# Patient Record
Sex: Female | Born: 1990 | Race: White | Hispanic: No | Marital: Married | Smoking: Never smoker
Health system: Southern US, Community
[De-identification: ages and names within clinical notes are randomized; demographics above are authoritative.]

## PROBLEM LIST (undated history)

## (undated) DIAGNOSIS — Z789 Other specified health status: Secondary | ICD-10-CM

## (undated) DIAGNOSIS — K900 Celiac disease: Secondary | ICD-10-CM

## (undated) DIAGNOSIS — M542 Cervicalgia: Secondary | ICD-10-CM

## (undated) HISTORY — DX: Cervicalgia: M54.2

## (undated) HISTORY — DX: Celiac disease: K90.0

---

## 2016-04-22 HISTORY — PX: TOOTH EXTRACTION: SUR596

## 2017-11-24 ENCOUNTER — Ambulatory Visit: Payer: Self-pay | Admitting: Diagnostic Neuroimaging

## 2017-12-29 ENCOUNTER — Ambulatory Visit: Payer: BC Managed Care – PPO | Admitting: Diagnostic Neuroimaging

## 2017-12-29 ENCOUNTER — Encounter: Payer: Self-pay | Admitting: Diagnostic Neuroimaging

## 2017-12-29 ENCOUNTER — Encounter

## 2017-12-29 VITALS — BP 101/71 | HR 85 | Ht 62.0 in | Wt 97.6 lb

## 2017-12-29 DIAGNOSIS — R200 Anesthesia of skin: Secondary | ICD-10-CM | POA: Diagnosis not present

## 2017-12-29 DIAGNOSIS — M542 Cervicalgia: Secondary | ICD-10-CM

## 2017-12-29 DIAGNOSIS — F0781 Postconcussional syndrome: Secondary | ICD-10-CM | POA: Diagnosis not present

## 2017-12-29 NOTE — Patient Instructions (Signed)
RIGHT SIDED NUMBNESS / PAIN (POST CONCUSSION SYNDROME)  - check MRI brain and cervical spine  - ok to return to PT  - consider massage therapy or acupuncture  - optimize sleep, nutrition, PT exercises
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
## 2018-01-01 ENCOUNTER — Telehealth: Payer: Self-pay | Admitting: Diagnostic Neuroimaging

## 2018-01-01 NOTE — Telephone Encounter (Signed)
Patient returned my call she is scheduled for 01/03/18 at [facility]. °

## 2018-01-01 NOTE — Telephone Encounter (Signed)
lvm for pt to call back about scheduling mri  [employer] Auth: [id number] (exp. 01/01/18 to 01/30/18)

## 2018-01-03 ENCOUNTER — Ambulatory Visit: Payer: BC Managed Care – PPO

## 2018-01-03 DIAGNOSIS — R200 Anesthesia of skin: Secondary | ICD-10-CM

## 2018-01-03 DIAGNOSIS — M542 Cervicalgia: Secondary | ICD-10-CM

## 2018-01-05 ENCOUNTER — Telehealth: Payer: Self-pay | Admitting: *Deleted

## 2018-01-05 NOTE — Telephone Encounter (Signed)
Spoke with patient and informed her that her MRI brain is normal. Her MRI cervical spine is unremarkable,  shows no narrowing or cord lesions. Advised there is minimal bulging. Advised she continue with PT, per Dr Richrd HumblesPenumalli's plan she may consider massage therapy or acupuncture. She asked if the bulging would go away. This RN advised that hopefully with PT it will improve. She verbalized understanding, appreciation for call.

## 2020-10-30 ENCOUNTER — Ambulatory Visit (INDEPENDENT_AMBULATORY_CARE_PROVIDER_SITE_OTHER): Payer: BC Managed Care – PPO | Admitting: Women's Health

## 2020-10-30 ENCOUNTER — Other Ambulatory Visit (HOSPITAL_COMMUNITY)
Admission: RE | Admit: 2020-10-30 | Discharge: 2020-10-30 | Disposition: A | Discharge status: Home / Self Care | Payer: BC Managed Care – PPO | Source: Ambulatory Visit | Attending: Obstetrics & Gynecology | Admitting: Obstetrics & Gynecology

## 2020-10-30 ENCOUNTER — Encounter: Payer: Self-pay | Admitting: Women's Health

## 2020-10-30 ENCOUNTER — Other Ambulatory Visit: Payer: Self-pay

## 2020-10-30 VITALS — BP 103/77 | HR 81 | Ht 62.0 in | Wt 103.0 lb

## 2020-10-30 DIAGNOSIS — N941 Unspecified dyspareunia: Secondary | ICD-10-CM

## 2020-10-30 DIAGNOSIS — Z01419 Encounter for gynecological examination (general) (routine) without abnormal findings: Secondary | ICD-10-CM

## 2020-10-30 DIAGNOSIS — N760 Acute vaginitis: Secondary | ICD-10-CM

## 2020-10-30 DIAGNOSIS — N946 Dysmenorrhea, unspecified: Secondary | ICD-10-CM

## 2020-10-30 DIAGNOSIS — N926 Irregular menstruation, unspecified: Secondary | ICD-10-CM

## 2020-10-30 DIAGNOSIS — Z124 Encounter for screening for malignant neoplasm of cervix: Secondary | ICD-10-CM | POA: Insufficient documentation

## 2020-10-30 NOTE — Addendum Note (Signed)
Addended by: Annamarie Dawley on: 10/30/2020 01:22 PM   Modules accepted: Orders

## 2020-10-30 NOTE — Progress Notes (Signed)
GYN VISIT Patient name: Julie Pham MRN [id number]  Date of birth: 1990/12/16 Chief Complaint:   Dyspareunia  History of Present Illness:   Julie Pham is a 30 y.o. G82P0010 Caucasian female being seen today for report of pain w/ sex x 2 yrs, worse lately. Pain is mainly on bilateral sides, occ in center. Also recurrent BV and yeast, currently using clearBV (orders online) and helps. Has noticed golf-ball sized clots w/ periods for last few months. Periods are regular, last 10d, spotting 1st few days- then heavier for few days (changes regular tampon q 3hrs)-then spotting for few more days. +intense cramping w/ periods.     Patient's last menstrual period was 10/25/2020 (exact date). The current method of family planning is rhythm method.  Last pap >61yrs ago w/ PCP. Results were:  neg per pt  Depression screen [facility] 2/9 10/30/2020  Decreased Interest 0  Down, Depressed, Hopeless 0  PHQ - 2 Score 0  Altered sleeping 0  Tired, decreased energy 2  Change in appetite 0  Feeling bad or failure about yourself  0  Trouble concentrating 0  Moving slowly or fidgety/restless 0  Suicidal thoughts 0  PHQ-9 Score 2     GAD 7 : Generalized Anxiety Score 10/30/2020  Nervous, Anxious, on Edge 0  Control/stop worrying 0  Worry too much - different things 1  Trouble relaxing 0  Restless 0  Easily annoyed or irritable 0  Afraid - awful might happen 0  Total GAD 7 Score 1     Review of Systems:   Pertinent items are noted in HPI Denies fever/chills, dizziness, headaches, visual disturbances, fatigue, shortness of breath, chest pain, abdominal pain, vomiting, abnormal vaginal discharge/itching/odor/irritation, problems with periods, bowel movements, urination, or intercourse unless otherwise stated above.  Pertinent History Reviewed:  Reviewed past medical,surgical, social, obstetrical and family history.  Reviewed problem list, medications and allergies. Physical Assessment:   Vitals:    10/30/20 1242  BP: 103/77  Pulse: 81  Weight: 103 lb (46.7 kg)  Height: 5\' 2"  (1.575 m)  Body mass index is 18.84 kg/m.       Physical Examination:   General appearance: alert, well appearing, and in no distress  Mental status: alert, oriented to person, place, and time  Skin: warm & dry   Cardiovascular: normal heart rate noted  Respiratory: normal respiratory effort, no distress  Abdomen: soft, non-tender   Pelvic: VULVA: normal appearing vulva with no masses, tenderness or lesions, VAGINA: normal appearing vagina with normal color and discharge, no lesions, CERVIX: normal appearing cervix without discharge or lesions, UTERUS: uterus is normal size, shape, consistency and nontender, ADNEXA: normal adnexa in size, bilateral tenderness Lt>Rt, and no masses  Extremities: no edema   Chaperone:    No results found for this or any previous visit (from the past 24 hour(s)).  Assessment & Plan:  1) Dyspareunia w/ recurrent BV/yeast and recent large clots w/ prolonged painful periods> check gc/ct from pap, will get pelvic u/s (order placed and note routed to [facility] to schedule) and f/u in 2wks to discuss plan of care after results. Discussed possibility of endometriosis.   2) Screening for cervical cancer> pap today   Meds: No orders of the defined types were placed in this encounter.   Orders Placed This Encounter  Procedures   [facility] PELVIS (TRANSABDOMINAL ONLY)   US PELVIS TRANSVAGINAL NON-OB (TV ONLY)    Return in about 2 weeks (around 11/13/2020) for f/u in  office w/ me.  Cheral Marker CNM, WHNP-BC 10/30/2020 1:16 PM

## 2020-11-02 LAB — CYTOLOGY - PAP
Chlamydia: NEGATIVE
Comment: NEGATIVE
Comment: NEGATIVE
Comment: NORMAL
Diagnosis: NEGATIVE
High risk HPV: NEGATIVE
Neisseria Gonorrhea: NEGATIVE

## 2020-11-10 ENCOUNTER — Other Ambulatory Visit: Payer: Self-pay

## 2020-11-10 ENCOUNTER — Encounter (HOSPITAL_COMMUNITY): Payer: Self-pay

## 2020-11-10 ENCOUNTER — Ambulatory Visit (HOSPITAL_COMMUNITY): Admission: RE | Admit: 2020-11-10 | Payer: BC Managed Care – PPO | Source: Ambulatory Visit

## 2020-11-10 ENCOUNTER — Ambulatory Visit (HOSPITAL_COMMUNITY)
Admission: RE | Admit: 2020-11-10 | Discharge: 2020-11-10 | Disposition: A | Discharge status: Home / Self Care | Payer: BC Managed Care – PPO | Source: Ambulatory Visit | Attending: Women's Health | Admitting: Women's Health

## 2020-11-10 DIAGNOSIS — N941 Unspecified dyspareunia: Secondary | ICD-10-CM | POA: Diagnosis present

## 2020-11-16 ENCOUNTER — Ambulatory Visit: Payer: BC Managed Care – PPO | Admitting: Women's Health

## 2020-12-07 ENCOUNTER — Encounter: Payer: Self-pay | Admitting: Women's Health

## 2020-12-07 ENCOUNTER — Other Ambulatory Visit: Payer: Self-pay

## 2020-12-07 ENCOUNTER — Ambulatory Visit (INDEPENDENT_AMBULATORY_CARE_PROVIDER_SITE_OTHER): Payer: BC Managed Care – PPO | Admitting: Women's Health

## 2020-12-07 VITALS — BP 115/81 | HR 71 | Ht 62.0 in | Wt 104.0 lb

## 2020-12-07 DIAGNOSIS — N941 Unspecified dyspareunia: Secondary | ICD-10-CM | POA: Diagnosis not present

## 2020-12-07 DIAGNOSIS — N926 Irregular menstruation, unspecified: Secondary | ICD-10-CM

## 2020-12-07 DIAGNOSIS — N946 Dysmenorrhea, unspecified: Secondary | ICD-10-CM | POA: Diagnosis not present

## 2020-12-07 NOTE — Progress Notes (Signed)
GYN VISIT Patient name: Julie Pham MRN [id number]  Date of birth: [birth year] Chief Complaint:   Follow-up (Discuss [place] results)  History of Present Illness:   Julie Pham is a 30 y.o. G77P0010 Caucasian female being seen today for f/u after pelvic u/s done 6/21 for dyspareunia and long, heavy, painful periods.  Pap was normal w/ gc/ct neg and no evidence of bv/yeast, etc. Pelvic u/s 11/10/20 @ [facility]: FINDINGS: Uterus Measurements: 7.4 x 3.3 x 4.4 cm = volume: 56.6 mL. Uterus is anteverted. No discrete fibroid or other mass. Endometrium Thickness: 7.8 mm. No focal abnormality visualized. Small volume simple fluid noted within the endocervical canal. Right ovary Measurements: 3.1 x 2.1 x 2.4 cm = volume: 8.4 mL. Normal appearance/no adnexal mass. Left ovary Measurements: 3.2 x 1.8 x 2.1 cm = volume: 6.3 mL. Normal appearance/no adnexal mass. Other findings No abnormal free fluid.   IMPRESSION: Normal pelvic ultrasound. No findings to explain patient's symptoms identified.  Electronically Signed By: Rise Mu M.D.   On: 11/11/2020 01:47   Patient's last menstrual period was 11/25/2020. The current method of family planning is rhythm method.  Last pap 10/30/20. Results were: NILM w/ HRHPV negative  Depression screen [facility] 2/9 10/30/2020  Decreased Interest 0  Down, Depressed, Hopeless 0  PHQ - 2 Score 0  Altered sleeping 0  Tired, decreased energy 2  Change in appetite 0  Feeling bad or failure about yourself  0  Trouble concentrating 0  Moving slowly or fidgety/restless 0  Suicidal thoughts 0  PHQ-9 Score 2     GAD 7 : Generalized Anxiety Score 10/30/2020  Nervous, Anxious, on Edge 0  Control/stop worrying 0  Worry too much - different things 1  Trouble relaxing 0  Restless 0  Easily annoyed or irritable 0  Afraid - awful might happen 0  Total GAD 7 Score 1     Review of Systems:   Pertinent items are noted in HPI Denies fever/chills, dizziness,  headaches, visual disturbances, fatigue, shortness of breath, chest pain, abdominal pain, vomiting, abnormal vaginal discharge/itching/odor/irritation, problems with periods, bowel movements, urination, or intercourse unless otherwise stated above.  Pertinent History Reviewed:  Reviewed past medical,surgical, social, obstetrical and family history.  Reviewed problem list, medications and allergies. Physical Assessment:   Vitals:   12/07/20 1531  BP: 115/81  Pulse: 71  Weight: 104 lb (47.2 kg)  Height: 5\' 2"  (1.575 m)  Body mass index is 19.02 kg/m.       Physical Examination:   General appearance: alert, well appearing, and in no distress  Mental status: alert, oriented to person, place, and time  Skin: warm & dry   Cardiovascular: normal heart rate noted  Respiratory: normal respiratory effort, no distress  Abdomen: soft, non-tender   Pelvic: examination not indicated  Extremities: no edema   Chaperone: N/A    No results found for this or any previous visit (from the past 24 hour(s)).  Assessment & Plan:  1) Dyspareunia w/ heavy/long/painful periods> normal pap, neg gc/ct, normal pelvic u/s, discussed endometriosis as possibility, offered orilissa. Does not want any type of birth control. Pt states starting to eat better/cut out sugars in diet, wants to see if that improves symptoms. Will let me know if decides for orilissa.   Meds: No orders of the defined types were placed in this encounter.   No orders of the defined types were placed in this encounter.   No follow-ups on file.   CNM, WHNP-BC 12/07/2020 3:40 PM

## 2020-12-07 NOTE — Patient Instructions (Signed)
Dewayne Hatch (for endometriosis)

## 2021-05-23 NOTE — L&D Delivery Note (Signed)
   Delivery Note:   G2P1011 at [redacted]w[redacted]d  Admitting diagnosis: Encounter for induction of labor [Z34.90] Umbilical cord complication [O69.9XX0] Risks: GBS positive, umbilical vein thrombus Onset of labor: 04/08/2022 at 0706 IOL/Augmentation: AROM, Pitocin, and IP Foley ROM: 04/08/2022 at 0706, clear fluid  Complete dilation at 04/08/2022  1425 Onset of pushing at 1430 FHR second stage Cat I  Analgesia/Anesthesia intrapartum:Epidural  Pushing in lithotomy position with CNM and L&D staff support. Husband, Julie Pham, and mother, Julie Pham, present for birth and supportive.  Delivery of a Live born female  Birth Weight: 8 lb 6.8 oz (3820 g) APGAR: 8, 9  Newborn Delivery   Birth date/time: 04/08/2022 14:32:00 Delivery type: Vaginal, Spontaneous     in cephalic presentation, position OA to LOA.  APGAR:1 min-8 , 5 min-9   Nuchal Cord: No  Cord double clamped after cessation of pulsation, cut by Julie Pham.  Collection of cord blood for typing completed. Arterial cord blood sample-No    Placenta delivered-Spontaneous  with 3 vessels . Uterotonics: Pitocin Placenta to pathology for umbilical cord thrombus Uterine tone firm  Bleeding scant  1st degree  laceration identified.  Episiotomy:None  Local analgesia: N/A  Repair: 3-0 in usual fashion with excellent hemostasis Est. Blood Loss (mL):144.00   Complications: None  Mom to postpartum. Baby Julie Pham to Couplet care / Skin to Skin.  Delivery Report:  Review the Delivery Report for details.    [name], CNM, MSN 04/08/2022, 5:45 PM

## 2021-10-05 LAB — OB RESULTS CONSOLE HIV ANTIBODY (ROUTINE TESTING): HIV: NONREACTIVE

## 2021-10-05 LAB — OB RESULTS CONSOLE GC/CHLAMYDIA
Chlamydia: NEGATIVE
Neisseria Gonorrhea: NEGATIVE

## 2021-10-05 LAB — HEPATITIS C ANTIBODY: HCV Ab: NEGATIVE

## 2021-10-05 LAB — OB RESULTS CONSOLE HEPATITIS B SURFACE ANTIGEN: Hepatitis B Surface Ag: NEGATIVE

## 2021-10-05 LAB — OB RESULTS CONSOLE RUBELLA ANTIBODY, IGM: Rubella: IMMUNE

## 2022-02-14 ENCOUNTER — Encounter: Payer: Self-pay | Admitting: *Deleted

## 2022-03-14 ENCOUNTER — Encounter (HOSPITAL_COMMUNITY): Payer: Self-pay

## 2022-03-14 ENCOUNTER — Inpatient Hospital Stay (HOSPITAL_COMMUNITY)
Admission: AD | Admit: 2022-03-14 | Discharge: 2022-03-14 | Disposition: A | Discharge status: Home / Self Care | Payer: BC Managed Care – PPO | Attending: Obstetrics | Admitting: Obstetrics

## 2022-03-14 DIAGNOSIS — Z3A37 37 weeks gestation of pregnancy: Secondary | ICD-10-CM | POA: Diagnosis not present

## 2022-03-14 DIAGNOSIS — O479 False labor, unspecified: Secondary | ICD-10-CM

## 2022-03-14 DIAGNOSIS — O471 False labor at or after 37 completed weeks of gestation: Secondary | ICD-10-CM | POA: Insufficient documentation

## 2022-03-14 DIAGNOSIS — O2243 Hemorrhoids in pregnancy, third trimester: Secondary | ICD-10-CM | POA: Insufficient documentation

## 2022-03-14 MED ORDER — HYDROCORTISONE (PERIANAL) 2.5 % EX CREA
TOPICAL_CREAM | Freq: Once | CUTANEOUS | Status: AC
  Administered 2022-03-14: 1 via RECTAL
  Filled 2022-03-14: qty 28.35

## 2022-03-14 MED ORDER — WITCH HAZEL-GLYCERIN EX PADS
MEDICATED_PAD | Freq: Once | CUTANEOUS | Status: AC
  Administered 2022-03-14: 1 via TOPICAL

## 2022-03-14 NOTE — MAU Provider Note (Signed)
History     [id number]  Arrival date and time: 03/14/22 0415    Chief Complaint  Patient presents with   Contractions     HPI Julie Pham is a 31 y.o. at [redacted]w[redacted]d who presents for contractions & hemorrhoids. Reports regular contractions that started this morning. Felt a hemorrhoid come out once the painful contractions started. Report rectal pain with the hemorrhoids. Last BM was this morning. Denies constipation, rectal bleeding, vaginal bleeding, or LOF. Reports good fetal movement.    OB History     Gravida  2   Para      Term      Preterm      AB  1   Living         SAB  1   IAB      Ectopic      Multiple      Live Births              Past Medical History:  Diagnosis Date   Celiac disease    Neck pain     Past Surgical History:  Procedure Laterality Date   TOOTH EXTRACTION  04/2016    Family History  Problem Relation Age of Onset   Heart disease Paternal Grandfather    Celiac disease Paternal Aunt    Seizures Neg Hx     Allergies  Allergen Reactions   Amoxicillin     No current facility-administered medications on file prior to encounter.   Current Outpatient Medications on File Prior to Encounter  Medication Sig Dispense Refill   Prenatal Vit-Fe Fumarate-FA (PRENATAL MULTIVITAMIN) TABS tablet Take 1 tablet by mouth daily at 12 noon.       ROS Pertinent positives and negative per HPI, all others reviewed and negative  Physical Exam   BP 117/76 (BP Location: Left Arm)   Pulse 100   Temp 97.6 F (36.4 C) (Oral)   Resp 17   Ht 5\' 2"  (1.575 m)   Wt 66.2 kg   SpO2 98%   BMI 26.70 kg/m   Patient Vitals for the past 24 hrs:  BP Temp Temp src Pulse Resp SpO2 Height Weight  03/14/22 0510 -- -- -- -- -- 98 % -- --  03/14/22 0505 -- -- -- -- -- 98 % -- --  03/14/22 0500 -- -- -- -- -- 98 % -- --  03/14/22 0455 -- -- -- -- -- 98 % -- --  03/14/22 0449 117/76 97.6 F (36.4 C) Oral 100 17 98 % 5\' 2"  (1.575 m) 66.2 kg     Physical Exam Vitals and nursing note reviewed. Exam conducted with a chaperone present.  Constitutional:      General: She is not in acute distress.    Appearance: Normal appearance.  HENT:     Head: Normocephalic and atraumatic.  Eyes:     General: No scleral icterus.    Extraocular Movements: Extraocular movements intact.     Conjunctiva/sclera: Conjunctivae normal.     Pupils: Pupils are equal, round, and reactive to light.  Pulmonary:     Effort: Pulmonary effort is normal. No respiratory distress.  Genitourinary:    Rectum: External hemorrhoid present.     Comments: Pink external hemorrhoid x 2, ~1 cm Skin:    General: Skin is warm and dry.  Neurological:     Mental Status: She is alert.  Psychiatric:        Mood and Affect: Mood normal.  Behavior: Behavior normal.      Cervical Exam Dilation: Fingertip Effacement (%): Thick Cervical Position: Anterior Exam by:: Anastasio Champion, RN  FHT Baseline 130, moderate variability, 15x15 accels, no decels Toco: Q 2-6 minutes Cat: 1  Labs No results found for this or any previous visit (from the past 24 hour(s)).  Imaging No results found.  MAU Course  Procedures Lab Orders  No laboratory test(s) ordered today   Meds ordered this encounter  Medications   witch hazel-glycerin (TUCKS) pad   hydrocortisone (ANUSOL-HC) 2.5 % rectal cream   Imaging Orders  No imaging studies ordered today    MDM Reviewed prenatal records scanned in media. GBS positive. Wants water birth.   Labor evaluation performed by RN. Cervix unchanged after 2 hours of monitoring.   Witch hazel & hydrocortisone applied to hemorrhoids in MAU. Remaining product sent home for patient use.  Assessment and Plan   1. False labor  -Labor precautions & fetal kick counts -Keep scheduled OB appt this week  2. Hemorrhoids during pregnancy in third trimester  -Use topical meds as needed -Avoid constipation  3. [redacted] weeks gestation of  pregnancy     Julie Horn, NP 03/14/22 7:02 AM

## 2022-03-14 NOTE — MAU Note (Addendum)
.  Julie Pham is a 31 y.o. at [redacted]w[redacted]d here in MAU reporting: contractions that became increasingly intense at 0200. Pt denies SROM, vaginal bleeding or bloody show. Endorses + fetal movement. Denies complications with pregnancy.  Onset of complaint: 0200 Pain score:  Vitals:   03/14/22 0505 03/14/22 0510  BP:    Pulse:    Resp:    Temp:    SpO2: 98% 98%     FHT:126 Lab orders placed from triage:  mau labor

## 2022-04-07 ENCOUNTER — Inpatient Hospital Stay (HOSPITAL_COMMUNITY)
Admission: AD | Admit: 2022-04-07 | Discharge: 2022-04-09 | DRG: 807 | Disposition: A | Discharge status: Home / Self Care | Payer: BC Managed Care – PPO | Attending: Obstetrics | Admitting: Obstetrics

## 2022-04-07 ENCOUNTER — Encounter (HOSPITAL_COMMUNITY): Payer: Self-pay | Admitting: Obstetrics

## 2022-04-07 DIAGNOSIS — O480 Post-term pregnancy: Secondary | ICD-10-CM | POA: Diagnosis present

## 2022-04-07 DIAGNOSIS — O99824 Streptococcus B carrier state complicating childbirth: Secondary | ICD-10-CM | POA: Diagnosis present

## 2022-04-07 DIAGNOSIS — Z3A40 40 weeks gestation of pregnancy: Secondary | ICD-10-CM

## 2022-04-07 DIAGNOSIS — Z3490 Encounter for supervision of normal pregnancy, unspecified, unspecified trimester: Secondary | ICD-10-CM | POA: Diagnosis present

## 2022-04-07 DIAGNOSIS — O403XX0 Polyhydramnios, third trimester, not applicable or unspecified: Secondary | ICD-10-CM | POA: Diagnosis present

## 2022-04-07 DIAGNOSIS — O9902 Anemia complicating childbirth: Secondary | ICD-10-CM | POA: Diagnosis present

## 2022-04-07 DIAGNOSIS — Z880 Allergy status to penicillin: Secondary | ICD-10-CM

## 2022-04-07 DIAGNOSIS — B951 Streptococcus, group B, as the cause of diseases classified elsewhere: Secondary | ICD-10-CM | POA: Diagnosis present

## 2022-04-07 HISTORY — DX: Other specified health status: Z78.9

## 2022-04-07 LAB — CBC
HCT: 35.5 % — ABNORMAL LOW (ref 36.0–46.0)
Hemoglobin: 12.6 g/dL (ref 12.0–15.0)
MCH: 33.4 pg (ref 26.0–34.0)
MCHC: 35.5 g/dL (ref 30.0–36.0)
MCV: 94.2 fL (ref 80.0–100.0)
Platelets: 166 10*3/uL (ref 150–400)
RBC: 3.77 MIL/uL — ABNORMAL LOW (ref 3.87–5.11)
RDW: 13.7 % (ref 11.5–15.5)
WBC: 8.7 10*3/uL (ref 4.0–10.5)
nRBC: 0 % (ref 0.0–0.2)

## 2022-04-07 LAB — TYPE AND SCREEN
ABO/RH(D): A POS
Antibody Screen: NEGATIVE

## 2022-04-07 MED ORDER — LIDOCAINE HCL (PF) 1 % IJ SOLN: 30.0000 mL | INTRAMUSCULAR | Status: DC | PRN

## 2022-04-07 MED ORDER — LACTATED RINGERS IV BOLUS
1000.0000 mL | Freq: Once | INTRAVENOUS | Status: AC
  Administered 2022-04-07: 1000 mL via INTRAVENOUS

## 2022-04-07 MED ORDER — OXYTOCIN-SODIUM CHLORIDE 30-0.9 UT/500ML-% IV SOLN: 2.5000 [IU]/h | INTRAVENOUS | Status: DC

## 2022-04-07 MED ORDER — OXYTOCIN 10 UNIT/ML IJ SOLN: 10.0000 [IU] | Freq: Once | INTRAMUSCULAR | Status: DC

## 2022-04-07 MED ORDER — CEFAZOLIN SODIUM-DEXTROSE 2-4 GM/100ML-% IV SOLN
2.0000 g | Freq: Once | INTRAVENOUS | Status: AC
  Administered 2022-04-07: 2 g via INTRAVENOUS
  Filled 2022-04-07: qty 100

## 2022-04-07 MED ORDER — CLINDAMYCIN PHOSPHATE 900 MG/50ML IV SOLN
900.0000 mg | Freq: Three times a day (TID) | INTRAVENOUS | Status: DC
  Administered 2022-04-07 – 2022-04-08 (×2): 900 mg via INTRAVENOUS
  Filled 2022-04-07 (×5): qty 50

## 2022-04-07 MED ORDER — SODIUM CHLORIDE 0.9% FLUSH: 3.0000 mL | Freq: Two times a day (BID) | INTRAVENOUS | Status: DC

## 2022-04-07 MED ORDER — ACETAMINOPHEN 500 MG PO TABS: 1000.0000 mg | ORAL_TABLET | Freq: Four times a day (QID) | ORAL | Status: DC | PRN

## 2022-04-07 MED ORDER — SODIUM CHLORIDE 0.9 % IV SOLN: INTRAVENOUS | Status: DC | PRN

## 2022-04-07 MED ORDER — FENTANYL CITRATE (PF) 100 MCG/2ML IJ SOLN: 50.0000 ug | INTRAMUSCULAR | Status: DC | PRN

## 2022-04-07 MED ORDER — SOD CITRATE-CITRIC ACID 500-334 MG/5ML PO SOLN: 30.0000 mL | ORAL | Status: DC | PRN

## 2022-04-07 MED ORDER — SODIUM CHLORIDE 0.9% FLUSH: 3.0000 mL | INTRAVENOUS | Status: DC | PRN

## 2022-04-07 MED ORDER — OXYTOCIN BOLUS FROM INFUSION
333.0000 mL | Freq: Once | INTRAVENOUS | Status: AC
  Administered 2022-04-08: 333 mL via INTRAVENOUS

## 2022-04-07 MED ORDER — DIPHENHYDRAMINE HCL 50 MG/ML IJ SOLN: 50.0000 mg | Freq: Once | INTRAMUSCULAR | Status: AC

## 2022-04-07 MED ORDER — PENICILLIN G POT IN DEXTROSE 60000 UNIT/ML IV SOLN: 3.0000 10*6.[IU] | INTRAVENOUS | Status: DC

## 2022-04-07 MED ORDER — LACTATED RINGERS IV SOLN: 500.0000 mL | INTRAVENOUS | Status: DC | PRN

## 2022-04-07 MED ORDER — ONDANSETRON HCL 4 MG/2ML IJ SOLN: 4.0000 mg | Freq: Four times a day (QID) | INTRAMUSCULAR | Status: DC | PRN

## 2022-04-07 MED ORDER — CEFAZOLIN SODIUM-DEXTROSE 1-4 GM/50ML-% IV SOLN: 1.0000 g | Freq: Three times a day (TID) | INTRAVENOUS | Status: DC

## 2022-04-07 MED ORDER — CEFAZOLIN SODIUM-DEXTROSE 2-4 GM/100ML-% IV SOLN: 2.0000 g | Freq: Once | INTRAVENOUS | Status: DC

## 2022-04-07 MED ORDER — DIPHENHYDRAMINE HCL 50 MG/ML IJ SOLN
INTRAMUSCULAR | Status: AC
  Administered 2022-04-07: 50 mg
  Filled 2022-04-07: qty 1

## 2022-04-07 MED ORDER — SODIUM CHLORIDE 0.9 % IV SOLN: 5.0000 10*6.[IU] | Freq: Once | INTRAVENOUS | Status: DC

## 2022-04-07 MED ORDER — LACTATED RINGERS IV SOLN: INTRAVENOUS | Status: DC

## 2022-04-07 NOTE — Progress Notes (Signed)
S: Feeling more calm regarding plan of care. Agreeable to Foley balloon placement. Husband, Kipp Brood, and mother, Darl Pikes, present and supportive. Eagerly anticipating a baby girl "Rebekah".   O: Vitals:   04/07/22 1802  BP: 114/81  Pulse: 84  Resp: (!) 22  SpO2: 100%   FHT:  FHR: 135 bpm, variability: moderate,  accelerations:  Present,  decelerations:  Absent UC:   irregular, every 3-5 minutes SVE:    1/70/-2  Foley balloon placed without difficulty. 60cc of fluid instilled in balloon and taped to leg for traction. Patient tolerated procedure well.   A / P: Induction of labor due to clot in umbilical vein, Foley balloon placed with ease  Fetal Wellbeing:  Category I GBS: Positive, will treat with PCN Pain Control:  Labor support without medications Anticipated MOD:  Working towards NSVB  Holding in MAU due to staffing. Plan to start Pitocin 2x2 when on L&D.   Plan of care consult with Dr. Ernestina Penna.  [name], CNM, MSN 04/07/2022, 8:06 PM

## 2022-04-07 NOTE — H&P (Signed)
Julie Pham is a 31 y.o. G2P0010 at [redacted]w[redacted]d presenting for induction of labor. Pt notes onset contractions earlier in the day but these have since resolved. Good fetal movement, No vaginal bleeding, not leaking fluid.  Patient seen earlier today for labor check.  She was also noting slight vaginal bleeding at that time.  NST showed fetal heart rate remaining in the 170s.  This was followed by a BPP which was 8 out of 8 but also showing polyhydramnios and concerned for a thrombus in the umbilical vein.  Given this patient is admitted for delivery.  PNCare at [facility] since 12 wks -Dated by LMP consistent with 12-week ultrasound.  Patient notes late to care to make sure the pregnancy was continuing and she did not have a miscarriage. -Anemia on iron -GBS positive.  Patient notes rash with amoxicillin as a child.  We will treat with Ancef   Prenatal Transfer Tool  Maternal Diabetes: No Genetic Screening: Declined Maternal Ultrasounds/Referrals: Normal Fetal Ultrasounds or other Referrals:  None Maternal Substance Abuse:  No Significant Maternal Medications:  None Significant Maternal Lab Results: Group B Strep positive     OB History     Gravida  2   Para      Term      Preterm      AB  1   Living         SAB  1   IAB      Ectopic      Multiple      Live Births             Past Medical History:  Diagnosis Date   Celiac disease    Medical history non-contributory    Neck pain    Past Surgical History:  Procedure Laterality Date   TOOTH EXTRACTION  04/2016   Family History: family history includes Celiac disease in her paternal aunt; Heart disease in her paternal grandfather. Social History:  reports that she has never smoked. She has never used smokeless tobacco. She reports that she does not drink alcohol and does not use drugs.  Review of Systems - Negative except anxiety over finding of thrombus in umbilical vein     Blood pressure 114/81,  pulse 84, resp. rate (!) 22, SpO2 100 %.  Physical Exam:  Gen: well appearing, no distress Back: no CVAT Abd: gravid, NT, no RUQ pain LE: No edema, equal bilaterally, non-tender Toco: None FH: baseline on arrival 155's as patient settled into MAU baseline of 135's, accelerations present, no deceleratons, 10 beat variability  Prenatal labs: ABO, Rh: --/--/A POS (11/16 1800) Antibody: NEG (11/16 1800) Rubella:  Immune RPR:   Nonreactive HBsAg:   Negative HIV:   Negative GBS:   Positive 1 hr Glucola 99  Genetic screening declined Anatomy US normal   Assessment/Plan: 31 y.o. G2P0010 at [redacted]w[redacted]d Ultrasound with concern for thrombus in the umbilical vein.  Given this will move to delivery.  Care discussed with Dr. Judeth Cornfield from [facility].  Patient remains candidate for trial of labor with cesarean section for routine indications.  Patient understands she is no longer a candidate for water birth and excepts interventions including cervical Foley bulb and Pitocin. -GBS positive.  We will plan Ancef -Thrombus in the umbilical vein.  We will send umbilical cord for pathology.  We will plan maternal thrombophilia testing 3 months postpartum.   Lendon Colonel 04/07/2022, 8:07 PM

## 2022-04-07 NOTE — MAU Note (Signed)
RN called into patient room at approximately 2141 due to patient report of itching in her hands. Cefazolin IVPB stopped at 2143 and bolus of LR started. Vital signs obtained at 2145, see flowsheets. Patient then began reporting swelling in her face. [name], CNM called at 2145 to inform of allergic reaction. Yetta Barre, CNM provided verbal order for Benadryl, see MAR. After administration of Benadryl patient reported decreased itching and swelling. By 2205 patient was reporting relief of symptoms, RN called Yetta Barre, CNM back to update and CNM informed RN that she would put in new orders for GBS prophylaxis.   RN in room at 2300. Patient reporting that she feels much better. RN talked to patient about the new order for new ABX and patient requested that we wait a little bit before starting the new ABX.

## 2022-04-07 NOTE — MAU Note (Signed)
...  Julie Pham is a 31 y.o. at [redacted]w[redacted]d here in MAU reporting: Patient presents to MAU via [facility]. She reports she was in office for evaluation of vaginal bleeding and states while on the monitor her baby's heart rate dropped. She reports they then did an U/S and noted a blood clot in the umbilical vein. Patient reports she has been contracting occasionally for a week or so. Patient had two cervical exams today. One in office this AM and one this afternoon. Patient is 1 and 80%. She reports her bleeding is bloody and mucousy.   Patient very anxious upon presentation to MAU.   Vitals:   04/07/22 1802  BP: 114/81  Pulse: 84  Resp: (!) 22  SpO2: 100%     FHT: 170 initial external  Lab orders placed from triage:  IV start, LR Bolus, and continuous per [name], CNM who called prior to patient's arrival to MAU.

## 2022-04-08 ENCOUNTER — Inpatient Hospital Stay (HOSPITAL_COMMUNITY): Payer: BC Managed Care – PPO | Admitting: Anesthesiology

## 2022-04-08 ENCOUNTER — Encounter (HOSPITAL_COMMUNITY): Payer: Self-pay | Admitting: Obstetrics

## 2022-04-08 ENCOUNTER — Other Ambulatory Visit: Payer: Self-pay

## 2022-04-08 LAB — RPR: RPR Ser Ql: NONREACTIVE

## 2022-04-08 MED ORDER — OXYCODONE-ACETAMINOPHEN 5-325 MG PO TABS: 2.0000 | ORAL_TABLET | ORAL | Status: DC | PRN

## 2022-04-08 MED ORDER — ACETAMINOPHEN 325 MG PO TABS: 650.0000 mg | ORAL_TABLET | ORAL | Status: DC | PRN

## 2022-04-08 MED ORDER — DIPHENHYDRAMINE HCL 25 MG PO CAPS: 25.0000 mg | ORAL_CAPSULE | Freq: Four times a day (QID) | ORAL | Status: DC | PRN

## 2022-04-08 MED ORDER — TERBUTALINE SULFATE 1 MG/ML IJ SOLN: 0.2500 mg | Freq: Once | INTRAMUSCULAR | Status: DC | PRN

## 2022-04-08 MED ORDER — DIBUCAINE (PERIANAL) 1 % EX OINT: 1.0000 | TOPICAL_OINTMENT | CUTANEOUS | Status: DC | PRN

## 2022-04-08 MED ORDER — ONDANSETRON HCL 4 MG/2ML IJ SOLN: 4.0000 mg | INTRAMUSCULAR | Status: DC | PRN

## 2022-04-08 MED ORDER — ONDANSETRON HCL 4 MG PO TABS: 4.0000 mg | ORAL_TABLET | ORAL | Status: DC | PRN

## 2022-04-08 MED ORDER — PHENYLEPHRINE 80 MCG/ML (10ML) SYRINGE FOR IV PUSH (FOR BLOOD PRESSURE SUPPORT): 80.0000 ug | PREFILLED_SYRINGE | INTRAVENOUS | Status: DC | PRN

## 2022-04-08 MED ORDER — SIMETHICONE 80 MG PO CHEW: 80.0000 mg | CHEWABLE_TABLET | ORAL | Status: DC | PRN

## 2022-04-08 MED ORDER — ACETAMINOPHEN 325 MG PO TABS
650.0000 mg | ORAL_TABLET | ORAL | Status: DC | PRN
  Administered 2022-04-09 (×3): 650 mg via ORAL
  Filled 2022-04-08 (×3): qty 2

## 2022-04-08 MED ORDER — WITCH HAZEL-GLYCERIN EX PADS: 1.0000 | MEDICATED_PAD | CUTANEOUS | Status: DC | PRN

## 2022-04-08 MED ORDER — LACTATED RINGERS IV SOLN: INTRAVENOUS | Status: DC

## 2022-04-08 MED ORDER — OXYTOCIN-SODIUM CHLORIDE 30-0.9 UT/500ML-% IV SOLN
1.0000 m[IU]/min | INTRAVENOUS | Status: DC
  Administered 2022-04-08: 2 m[IU]/min via INTRAVENOUS
  Filled 2022-04-08: qty 500

## 2022-04-08 MED ORDER — OXYTOCIN BOLUS FROM INFUSION: 333.0000 mL | Freq: Once | INTRAVENOUS | Status: DC

## 2022-04-08 MED ORDER — OXYTOCIN-SODIUM CHLORIDE 30-0.9 UT/500ML-% IV SOLN: 1.0000 m[IU]/min | INTRAVENOUS | Status: DC

## 2022-04-08 MED ORDER — LACTATED RINGERS IV SOLN
500.0000 mL | Freq: Once | INTRAVENOUS | Status: AC
  Administered 2022-04-08: 500 mL via INTRAVENOUS

## 2022-04-08 MED ORDER — LIDOCAINE HCL (PF) 1 % IJ SOLN: 30.0000 mL | INTRAMUSCULAR | Status: DC | PRN

## 2022-04-08 MED ORDER — LIDOCAINE HCL (PF) 1 % IJ SOLN
INTRAMUSCULAR | Status: DC | PRN
  Administered 2022-04-08: 4 mL via EPIDURAL
  Administered 2022-04-08: 5 mL via EPIDURAL

## 2022-04-08 MED ORDER — IBUPROFEN 600 MG PO TABS
600.0000 mg | ORAL_TABLET | Freq: Four times a day (QID) | ORAL | Status: DC
  Filled 2022-04-08 (×3): qty 1

## 2022-04-08 MED ORDER — PRENATAL MULTIVITAMIN CH
1.0000 | ORAL_TABLET | Freq: Every day | ORAL | Status: DC
  Administered 2022-04-09: 1 via ORAL
  Filled 2022-04-08: qty 1

## 2022-04-08 MED ORDER — ZOLPIDEM TARTRATE 5 MG PO TABS: 5.0000 mg | ORAL_TABLET | Freq: Every evening | ORAL | Status: DC | PRN

## 2022-04-08 MED ORDER — FENTANYL-BUPIVACAINE-NACL 0.5-0.125-0.9 MG/250ML-% EP SOLN
12.0000 mL/h | EPIDURAL | Status: DC | PRN
  Administered 2022-04-08: 12 mL/h via EPIDURAL
  Filled 2022-04-08: qty 250

## 2022-04-08 MED ORDER — SOD CITRATE-CITRIC ACID 500-334 MG/5ML PO SOLN: 30.0000 mL | ORAL | Status: DC | PRN

## 2022-04-08 MED ORDER — SENNOSIDES-DOCUSATE SODIUM 8.6-50 MG PO TABS
2.0000 | ORAL_TABLET | Freq: Every day | ORAL | Status: DC
  Filled 2022-04-08 (×2): qty 2

## 2022-04-08 MED ORDER — OXYCODONE-ACETAMINOPHEN 5-325 MG PO TABS: 1.0000 | ORAL_TABLET | ORAL | Status: DC | PRN

## 2022-04-08 MED ORDER — COCONUT OIL OIL
1.0000 | TOPICAL_OIL | Status: DC | PRN
  Administered 2022-04-09: 1 via TOPICAL

## 2022-04-08 MED ORDER — EPHEDRINE 5 MG/ML INJ: 10.0000 mg | INTRAVENOUS | Status: DC | PRN

## 2022-04-08 MED ORDER — DIPHENHYDRAMINE HCL 50 MG/ML IJ SOLN: 12.5000 mg | INTRAMUSCULAR | Status: DC | PRN

## 2022-04-08 MED ORDER — LACTATED RINGERS IV SOLN
500.0000 mL | INTRAVENOUS | Status: DC | PRN
  Administered 2022-04-08: 500 mL via INTRAVENOUS

## 2022-04-08 MED ORDER — OXYTOCIN-SODIUM CHLORIDE 30-0.9 UT/500ML-% IV SOLN
2.5000 [IU]/h | INTRAVENOUS | Status: DC
  Administered 2022-04-08: 2.5 [IU]/h via INTRAVENOUS

## 2022-04-08 MED ORDER — BENZOCAINE-MENTHOL 20-0.5 % EX AERO
1.0000 | INHALATION_SPRAY | CUTANEOUS | Status: DC | PRN
  Administered 2022-04-09: 1 via TOPICAL
  Filled 2022-04-08: qty 56

## 2022-04-08 MED ORDER — TETANUS-DIPHTH-ACELL PERTUSSIS 5-2.5-18.5 LF-MCG/0.5 IM SUSY: 0.5000 mL | PREFILLED_SYRINGE | Freq: Once | INTRAMUSCULAR | Status: DC

## 2022-04-08 MED ORDER — ONDANSETRON HCL 4 MG/2ML IJ SOLN: 4.0000 mg | Freq: Four times a day (QID) | INTRAMUSCULAR | Status: DC | PRN

## 2022-04-08 NOTE — Progress Notes (Signed)
S: Comfortable with epidural. Family at the bedside.   O: Vitals:   04/08/22 1130 04/08/22 1200 04/08/22 1208 04/08/22 1230  BP: 118/75 105/73  111/70  Pulse: 78 90  85  Resp:      Temp:   99.4 F (37.4 C)   TempSrc:   Axillary   SpO2:       FHT:  FHR: 140 bpm, variability: minimal ,  accelerations:  Present,  decelerations:  Present late decels over the last hour UC:   regular, every 2-4 minutes SVE:   Dilation: 7 Effacement (%): 80 Station: 0 Exam by:: [name], CNM  A / P: Induction of labor due to umbilical vein thrombus, progressing well on Pitocin  Fetal Wellbeing:  Category I and Category II GBS: Positive, adequately treated with Clindamycin Pain Control:  Epidural Anticipated MOD:   Working towards NSVB  Dr. Billy Coast updated on patient status and plan of care.   [name], CNM, MSN 04/08/2022, 1:20 PM

## 2022-04-08 NOTE — Progress Notes (Signed)
S: Comfortable with epidural. Discussed the R/B/A of AROM for labor induction and patient consents to procedure. Husband, Julie Pham, and mother, Julie Pham, present and supportive.   O: Vitals:   04/08/22 0710 04/08/22 0715 04/08/22 0720 04/08/22 0730  BP: 94/77 115/79 117/68 98/69  Pulse: (!) 124 95 79 86  Resp:    18  Temp:    97.8 F (36.6 C)  TempSrc:    Oral  SpO2: 100% 100% 100% 100%   FHT:  FHR: 125 bpm, variability: moderate,  accelerations:  Present,  decelerations:  Absent UC:   irregular, every 4-5 minutes SVE:   Dilation: 4 Effacement (%): 70 Station: -2 Exam by:: [name] CNM  AROM of a copious amount of clear fluid at 0706.   A / P: Induction of labor due to umbilical vein thrombus, s/p Foley balloon overnight in MAU, now AROMed for clear fluid  Fetal Wellbeing:  Category I GBS: Allergic rxn to Ancef, changed to Clindamycin, 1 dose complete, no rxn Pain Control:  Epidural Anticipated MOD:  NSVD  Will start Pitocin 2x2. Encourage frequent position changes to facilitate fetal rotation and descent.   Dr. Ernestina Penna updated on status and plan of care.   [name], CNM, MSN 04/08/2022, 8:17 AM

## 2022-04-08 NOTE — Anesthesia Preprocedure Evaluation (Signed)
Anesthesia Evaluation  Patient identified by MRN, date of birth, ID band Patient awake    Reviewed: Allergy & Precautions, NPO status , Patient's Chart, lab work & pertinent test results  History of Anesthesia Complications Negative for: history of anesthetic complications  Airway Mallampati: II   Neck ROM: Full    Dental   Pulmonary neg pulmonary ROS   Pulmonary exam normal        Cardiovascular negative cardio ROS Normal cardiovascular exam     Neuro/Psych negative neurological ROS  negative psych ROS   GI/Hepatic Neg liver ROS,,, Celiac disease    Endo/Other  negative endocrine ROS    Renal/GU negative Renal ROS     Musculoskeletal negative musculoskeletal ROS (+)    Abdominal   Peds  Hematology negative hematology ROS (+)   Anesthesia Other Findings   Reproductive/Obstetrics (+) Pregnancy                             Anesthesia Physical Anesthesia Plan  ASA: 2  Anesthesia Plan: Epidural   Post-op Pain Management:    Induction:   PONV Risk Score and Plan: 2 and Treatment may vary due to age or medical condition  Airway Management Planned: Natural Airway  Additional Equipment: None  Intra-op Plan:   Post-operative Plan:   Informed Consent: I have reviewed the patients History and Physical, chart, labs and discussed the procedure including the risks, benefits and alternatives for the proposed anesthesia with the patient or authorized representative who has indicated his/her understanding and acceptance.       Plan Discussed with: Anesthesiologist  Anesthesia Plan Comments: (Labs reviewed. Platelets acceptable, patient not taking any blood thinning medications. Per RN, FHR tracing reported to be stable enough for sitting procedure. Risks and benefits discussed with patient, including PDPH, backache, epidural hematoma, failed epidural, blood pressure changes, allergic  reaction, and nerve injury. Patient expressed understanding and wished to proceed.)       Anesthesia Quick Evaluation

## 2022-04-08 NOTE — Anesthesia Procedure Notes (Signed)
Epidural Patient location during procedure: OB Start time: 04/08/2022 6:31 AM End time: 04/08/2022 6:34 AM  Staffing Anesthesiologist: Beryle Lathe, MD Performed: anesthesiologist   Preanesthetic Checklist Completed: patient identified, IV checked, risks and benefits discussed, monitors and equipment checked, pre-op evaluation and timeout performed  Epidural Patient position: sitting Prep: DuraPrep Patient monitoring: continuous pulse ox and blood pressure Approach: midline Location: L2-L3 Injection technique: LOR saline  Needle:  Needle type: Tuohy  Needle gauge: 17 G Needle length: 9 cm Needle insertion depth: 4 cm Catheter size: 19 Gauge Catheter at skin depth: 9 cm Test dose: negative and Other (1% lidocaine)  Assessment Events: blood not aspirated  Additional Notes Patient identified. Risks including, but not limited to, bleeding, infection, nerve damage, paralysis, inadequate analgesia, blood pressure changes, nausea, vomiting, allergic reaction, postpartum back pain, itching, and headache were discussed. Patient expressed understanding and wished to proceed. Sterile prep and drape, including hand hygiene, mask, and sterile gloves were used. The patient was positioned and the spine was prepped. The skin was anesthetized with lidocaine. No paraesthesia or other complication noted. The patient did not experience any signs of intravascular injection such as tinnitus or metallic taste in mouth, nor signs of intrathecal spread such as rapid motor block. Please see nursing notes for vital signs. The patient tolerated the procedure well.   Leslye Peer, MDReason for block:procedure for pain

## 2022-04-09 DIAGNOSIS — O9902 Anemia complicating childbirth: Secondary | ICD-10-CM | POA: Diagnosis not present

## 2022-04-09 LAB — CBC
HCT: 27.2 % — ABNORMAL LOW (ref 36.0–46.0)
Hemoglobin: 9.5 g/dL — ABNORMAL LOW (ref 12.0–15.0)
MCH: 32.5 pg (ref 26.0–34.0)
MCHC: 34.9 g/dL (ref 30.0–36.0)
MCV: 93.2 fL (ref 80.0–100.0)
Platelets: 132 10*3/uL — ABNORMAL LOW (ref 150–400)
RBC: 2.92 MIL/uL — ABNORMAL LOW (ref 3.87–5.11)
RDW: 13.7 % (ref 11.5–15.5)
WBC: 13.1 10*3/uL — ABNORMAL HIGH (ref 4.0–10.5)
nRBC: 0 % (ref 0.0–0.2)

## 2022-04-09 MED ORDER — POLYSACCHARIDE IRON COMPLEX 150 MG PO CAPS
150.0000 mg | ORAL_CAPSULE | Freq: Every day | ORAL | Status: DC
  Filled 2022-04-09: qty 1

## 2022-04-09 MED ORDER — IBUPROFEN 600 MG PO TABS: 600.0000 mg | ORAL_TABLET | Freq: Four times a day (QID) | ORAL | 0 refills | Status: AC

## 2022-04-09 MED ORDER — ACETAMINOPHEN 325 MG PO TABS: 650.0000 mg | ORAL_TABLET | ORAL | 1 refills | Status: AC | PRN

## 2022-04-09 MED ORDER — MAGNESIUM OXIDE -MG SUPPLEMENT 400 (240 MG) MG PO TABS: 400.0000 mg | ORAL_TABLET | Freq: Every day | ORAL | 1 refills | Status: AC

## 2022-04-09 MED ORDER — MAGNESIUM OXIDE -MG SUPPLEMENT 400 (240 MG) MG PO TABS
400.0000 mg | ORAL_TABLET | Freq: Every day | ORAL | Status: DC
  Administered 2022-04-09: 400 mg via ORAL
  Filled 2022-04-09: qty 1

## 2022-04-09 MED ORDER — POLYSACCHARIDE IRON COMPLEX 150 MG PO CAPS: 150.0000 mg | ORAL_CAPSULE | Freq: Every day | ORAL | 1 refills | Status: AC

## 2022-04-09 NOTE — Discharge Summary (Signed)
OB Discharge Summary  Patient Name: Julie Pham DOB: 02/02/1991 MRN: [id number]  Date of admission: 04/07/2022 Delivering provider: [name] K   Admitting diagnosis: Encounter for induction of labor [Z34.90] Umbilical cord complication [O69.9XX0] Intrauterine pregnancy: [redacted]w[redacted]d     Secondary diagnosis: Patient Active Problem List   Diagnosis Date Noted   Maternal anemia, with delivery 04/09/2022   Umbilical cord complication 04/08/2022   SVD (spontaneous vaginal delivery) 04/08/2022   First degree perineal laceration 04/08/2022   Postpartum care following vaginal delivery 11/17 04/08/2022   Encounter for induction of labor 04/07/2022   Positive GBS test 04/07/2022    Date of discharge: 04/09/2022   Discharge diagnosis: Principal Problem:   Postpartum care following vaginal delivery 11/17 Active Problems:   Encounter for induction of labor   Positive GBS test   Umbilical cord complication   SVD (spontaneous vaginal delivery)   First degree perineal laceration   Maternal anemia, with delivery                                                            Augmentation: AROM, Pitocin, and IP Foley Pain control: Epidural  Laceration:1st degree  Complications: None  Hospital course:  Induction of Labor With Vaginal Delivery   31 y.o. yo G2P1011 at [redacted]w[redacted]d was admitted to the hospital 04/07/2022 for induction of labor.  Indication for induction:  umbilical cord thrombus .  Membrane Rupture Time/Date: 7:06 AM ,04/08/2022   Delivery Method:Vaginal, Spontaneous  Episiotomy: None  Lacerations:  1st degree  Details of delivery can be found in separate delivery note.  Patient had an uncomplicated postpartum course. Patient is discharged home 04/09/22.  Newborn Data: Birth date:04/08/2022  Birth time:2:32 PM  Gender:Female  Living status:Living  Apgars:8 ,9  Weight:3820 g   Physical exam  Vitals:   04/08/22 1615 04/08/22 1630 04/08/22 1745 04/08/22 2029  BP: 121/66  116/84 122/78 113/76  Pulse: 75 82 78 93  Resp:  16 16 16   Temp:  99 F (37.2 C) 99 F (37.2 C) 99.1 F (37.3 C)  TempSrc:  Oral Oral Oral  SpO2:  100% 100% 99%   General: alert, cooperative, and no distress Lochia: appropriate Uterine Fundus: firm Perineum: repair intact, no edema DVT Evaluation: No evidence of DVT seen on physical exam.  Labs: Lab Results  Component Value Date   WBC 13.1 (H) 04/09/2022   HGB 9.5 (L) 04/09/2022   HCT 27.2 (L) 04/09/2022   MCV 93.2 04/09/2022   PLT 132 (L) 04/09/2022      04/08/2022    8:29 PM  Edinburgh Postnatal Depression Scale Screening Tool  I have been able to laugh and see the funny side of things. 0  I have looked forward with enjoyment to things. 0  I have blamed myself unnecessarily when things went wrong. 1  I have been anxious or worried for no good reason. 2  I have felt scared or panicky for no good reason. 1  Things have been getting on top of me. 1  I have been so unhappy that I have had difficulty sleeping. 0  I have felt sad or miserable. 0  I have been so unhappy that I have been crying. 0  The thought of harming myself has occurred to me. 0  04/10/2022 Postnatal  Depression Scale Total 5   Discharge instructions:  per After Visit Summary  After Visit Meds:  Allergies as of 04/09/2022       Reactions   Ancef [cefazolin] Itching, Swelling, Anxiety   Amoxicillin         Medication List     TAKE these medications    acetaminophen 325 MG tablet Commonly known as: Tylenol Take 2 tablets (650 mg total) by mouth every 4 (four) hours as needed (for pain scale < 4).   ibuprofen 600 MG tablet Commonly known as: ADVIL Take 1 tablet (600 mg total) by mouth every 6 (six) hours.   iron polysaccharides 150 MG capsule Commonly known as: Ferrex 150 Take 1 capsule (150 mg total) by mouth daily.   magnesium oxide 400 (240 Mg) MG tablet Commonly known as: MAG-OX Take 1 tablet (400 mg total) by mouth daily.    prenatal multivitamin Tabs tablet Take 1 tablet by mouth daily at 12 noon.       Activity: Advance as tolerated. Pelvic rest for 6 weeks.   Newborn Data: Live born female  Birth Weight: 8 lb 6.8 oz (3820 g) APGAR: 8, 9  Newborn Delivery   Birth date/time: 04/08/2022 14:32:00 Delivery type: Vaginal, Spontaneous     Named Rebekah Baby Feeding: Breast Disposition:home with mother  Delivery Report:  Review the Delivery Report for details.    Follow up:  Follow-up Information     Noland Fordyce, MD. Schedule an appointment as soon as possible for a visit in 6 week(s).   Specialty: Obstetrics and Gynecology Contact information: [address] [place] [address] [phone]                Clancy Gourd, MSN 04/09/2022, 12:54 PM

## 2022-04-09 NOTE — Anesthesia Postprocedure Evaluation (Signed)
Anesthesia Post Note  Patient: Julie Pham [address] [facility]  Procedure(s) Performed: AN AD HOC LABOR EPIDURAL     Patient location during evaluation: Mother Baby Anesthesia Type: Epidural Level of consciousness: awake and alert Pain management: pain level controlled Vital Signs Assessment: post-procedure vital signs reviewed and stable Respiratory status: spontaneous breathing, nonlabored ventilation and respiratory function stable Cardiovascular status: stable Postop Assessment: no headache, no backache and epidural receding Anesthetic complications: no   No notable events documented.  Last Vitals:  Vitals:   04/08/22 1745 04/08/22 2029  BP: 122/78 113/76  Pulse: 78 93  Resp: 16 16  Temp: 37.2 C 37.3 C  SpO2: 100% 99%    Last Pain:  Vitals:   04/08/22 2029  TempSrc: Oral  PainSc: 5    Pain Goal:                   Rica Records

## 2022-04-09 NOTE — Lactation Note (Addendum)
This note was copied from a baby's chart. Lactation Consultation Note  Patient Name: Julie Pham [id number] Date: 04/09/2022 Reason for consult: Initial assessment;Term See Birth Parent MR- hx celiac and anemia. Age:29 hours Birth Parent was changing a stool diaper when LC entered the room. Infant had 4 stools and 2 voids since birth. Per Birth Parent, she been having nipple pain with few latches, will try the football hold position with this latch. Birth Parent latched infant on her right breast using the foot ball hold position, infant latched with depth, sustained latch and was still breastfeeding after 12 minutes when LC left the room. Birth Parent knows if she feels pain or pinching with latch to break latch and re-latch infant at the breast, she knows she can call RN/LC for further latch assistance if needed. Birth Parent will continue to BF infant according to hunger cues, on demand, 8 to 12+ times within 24 hours, STS. Mom made aware of O/P services, breastfeeding support groups, community resources, and our phone # for post-discharge questions.   Maternal Data    Feeding Mother's Current Feeding Choice: Breast Milk  LATCH Score Latch: Grasps breast easily, tongue down, lips flanged, rhythmical sucking.  Audible Swallowing: Spontaneous and intermittent  Type of Nipple: Everted at rest and after stimulation  Comfort (Breast/Nipple): Soft / non-tender  Hold (Positioning): Assistance needed to correctly position infant at breast and maintain latch.  LATCH Score: 9   Lactation Tools Discussed/Used    Interventions Interventions: Breast feeding basics reviewed;Assisted with latch;Support pillows;Adjust position;Position options;Skin to skin;Breast compression;LC Services brochure  Discharge Pump: Personal (Spectra 2 DEBP)  Consult Status Consult Status: Follow-up Date: 04/09/22 Follow-up type: In-patient    Frederico Hamman 04/09/2022, 12:42 AM

## 2022-04-12 LAB — SURGICAL PATHOLOGY

## 2022-04-15 ENCOUNTER — Telehealth (HOSPITAL_COMMUNITY): Payer: Self-pay | Admitting: *Deleted

## 2022-04-15 NOTE — Telephone Encounter (Signed)
Attempted hospital discharge follow-up call. Left message for patient to return RN call with any questions or concerns. Deforest Hoyles, RN, 04/15/22, [phone]

## 2022-04-22 IMAGING — US US PELVIS COMPLETE WITH TRANSVAGINAL
1 series · 14 of 25 positions shown · non-contrast
Comparison: None

CLINICAL DATA: Initial evaluation for dyspareunia.



[Series 1: us pelvic complete with transvaginal · 14 of 140 slices shown]
[im 1/140]
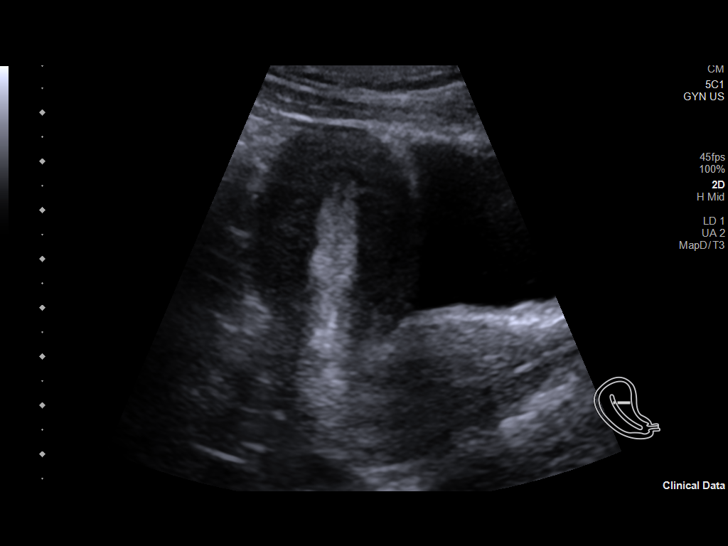
[im 12/140]
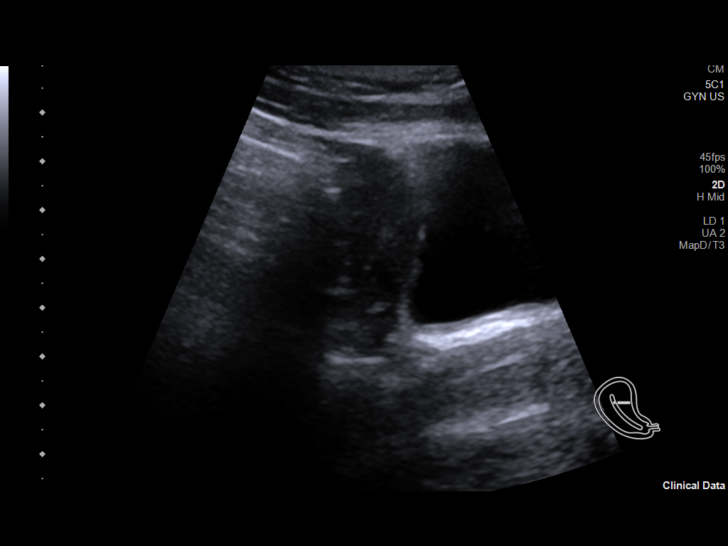
[im 24/140]
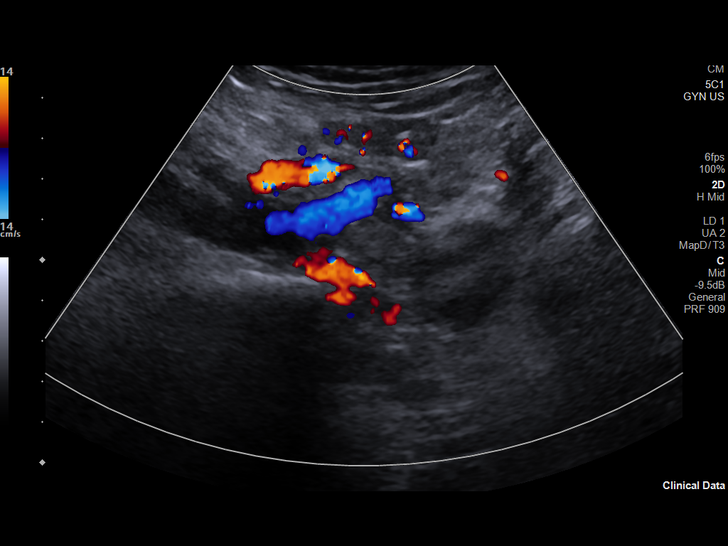
[im 35/140]
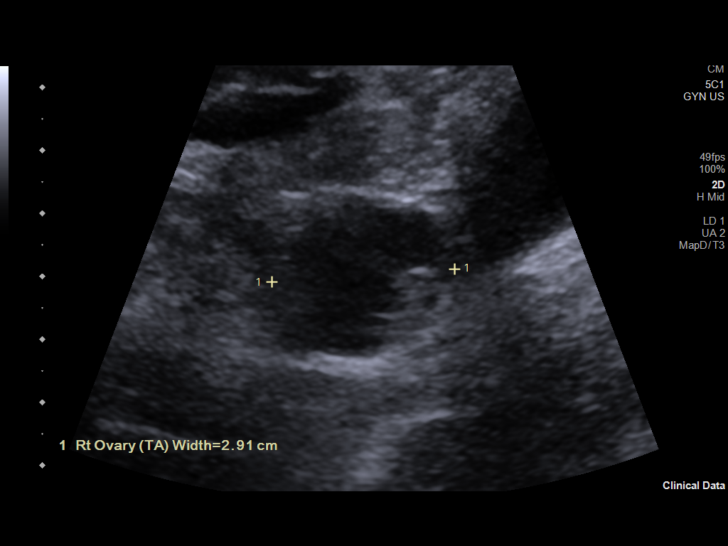
[im 47/140]
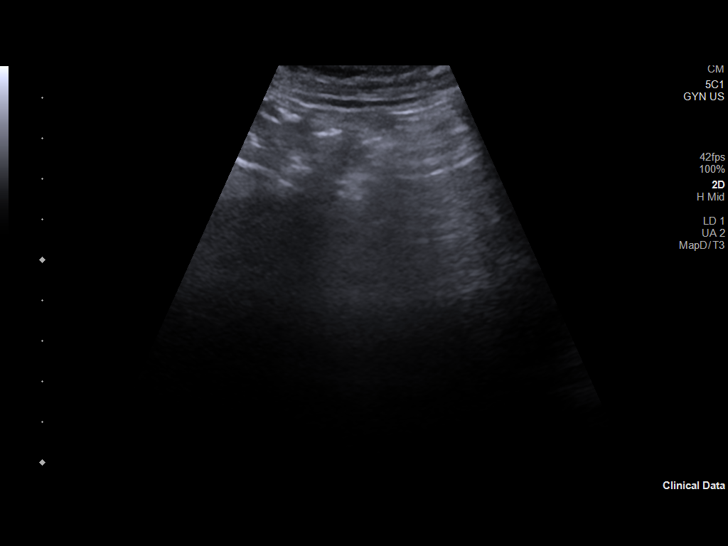
[im 53/140]
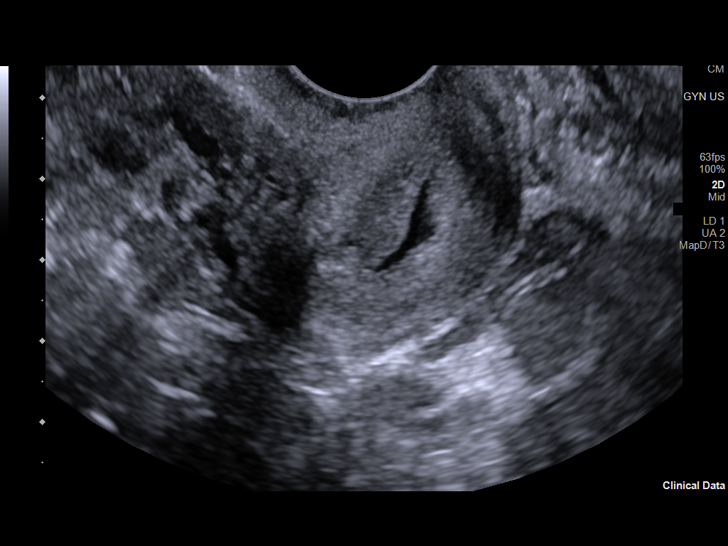
[im 64/140]
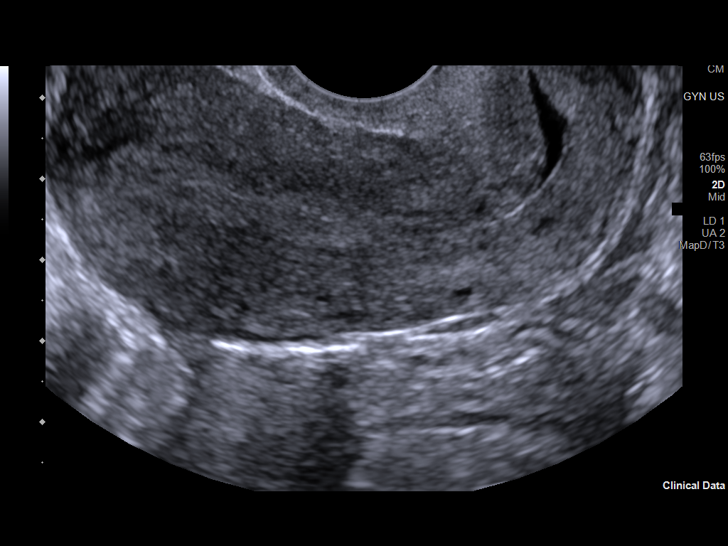
[im 76/140]
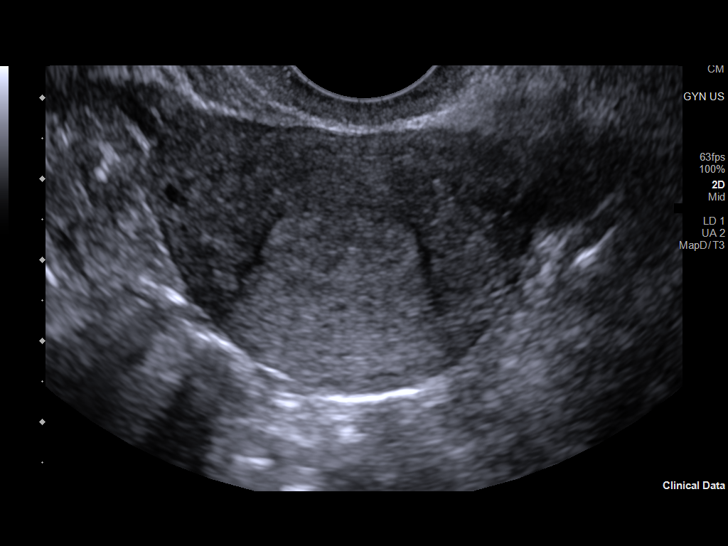
[im 87/140]
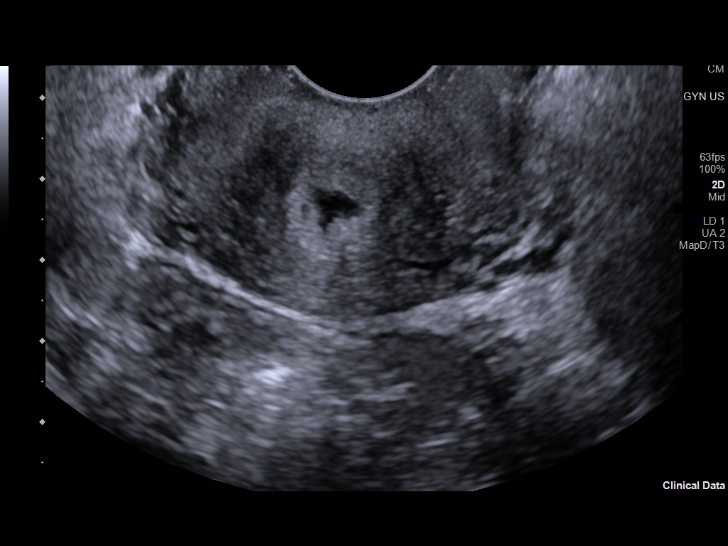
[im 93/140]
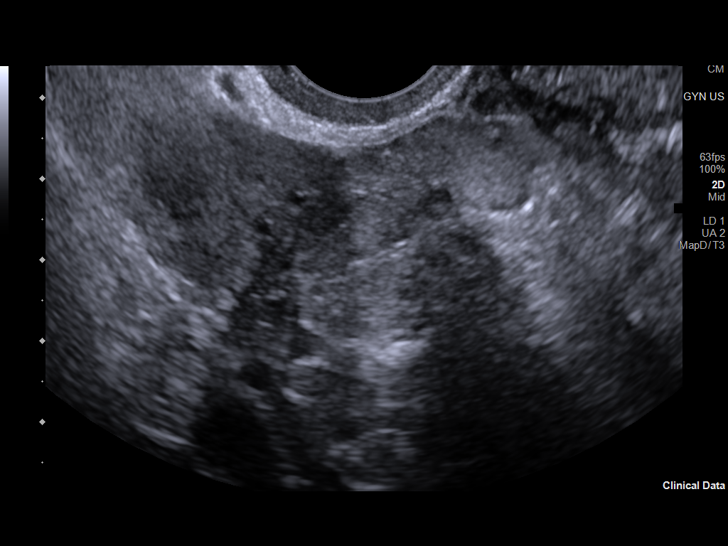
[im 105/140]
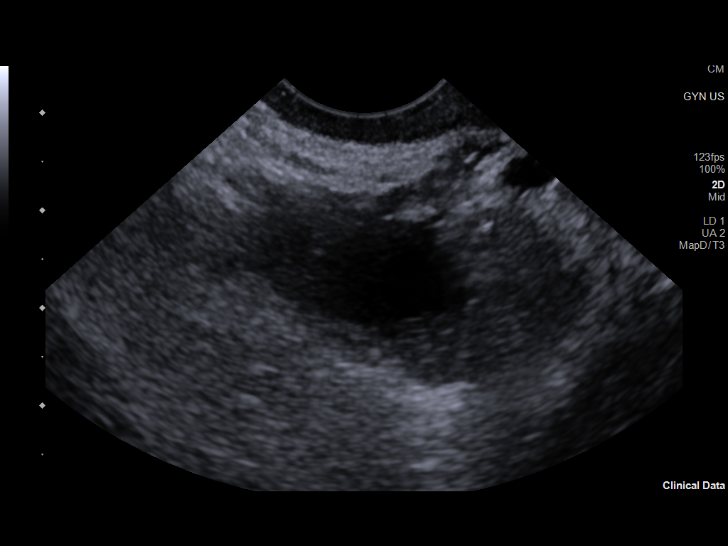
[im 116/140]
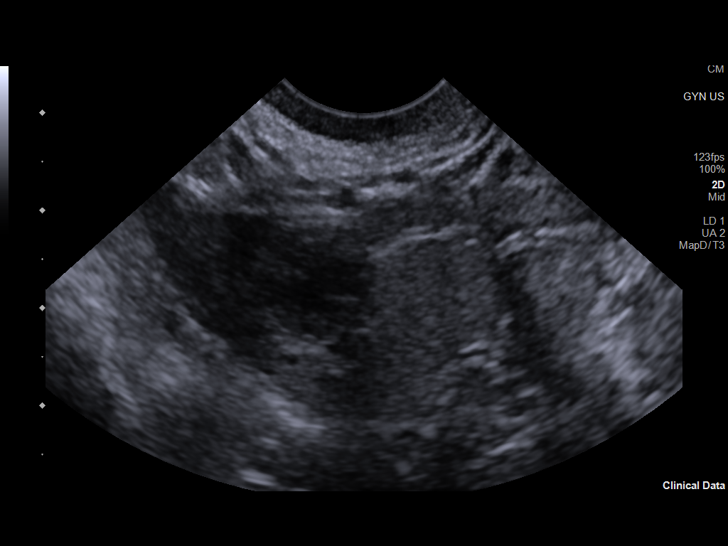
[im 128/140]
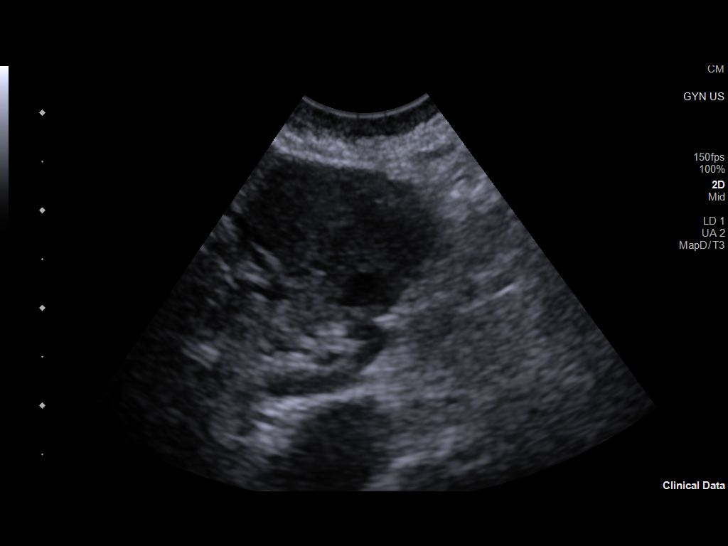
[im 140/140]
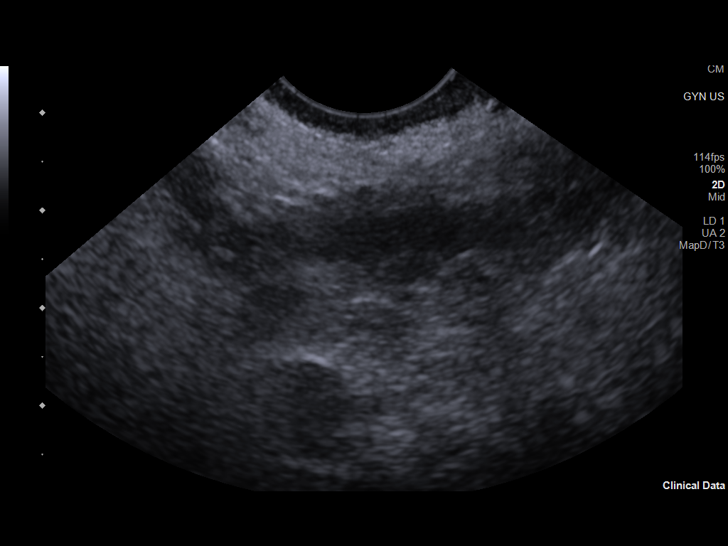

[14 of 25 positions shown; findings below may reference images not displayed]

FINDINGS: Uterus

Measurements: 7.4 x 3.3 x 4.4 cm = volume: 56.6 mL. Uterus is
anteverted. No discrete fibroid or other mass.

Endometrium

Thickness: 7.8 mm. No focal abnormality visualized. Small volume
simple fluid noted within the endocervical canal.

Right ovary

Measurements: 3.1 x 2.1 x 2.4 cm = volume: 8.4 mL. Normal
appearance/no adnexal mass.

Left ovary

Measurements: 3.2 x 1.8 x 2.1 cm = volume: 6.3 mL. Normal
appearance/no adnexal mass.

Other findings

No abnormal free fluid.
IMPRESSION: Normal pelvic ultrasound. No findings to explain patient's symptoms
identified.
